# Patient Record
Sex: Male | Born: 1975 | Race: Black or African American | Hispanic: No | Marital: Single | Smoking: Never smoker
Health system: Southern US, Community
[De-identification: ages and names within clinical notes are randomized; demographics above are authoritative.]

---

## 2001-05-20 ENCOUNTER — Emergency Department (HOSPITAL_COMMUNITY): Admission: EM | Admit: 2001-05-20 | Discharge: 2001-05-20 | Payer: Self-pay | Admitting: Emergency Medicine

## 2001-05-20 ENCOUNTER — Encounter: Payer: Self-pay | Admitting: Emergency Medicine

## 2001-05-25 ENCOUNTER — Emergency Department (HOSPITAL_COMMUNITY): Admission: EM | Admit: 2001-05-25 | Discharge: 2001-05-26 | Payer: Self-pay | Admitting: Emergency Medicine

## 2001-05-26 ENCOUNTER — Encounter: Payer: Self-pay | Admitting: Emergency Medicine

## 2001-05-27 ENCOUNTER — Emergency Department (HOSPITAL_COMMUNITY): Admission: EM | Admit: 2001-05-27 | Discharge: 2001-05-27 | Payer: Self-pay | Admitting: Emergency Medicine

## 2001-06-18 ENCOUNTER — Emergency Department (HOSPITAL_COMMUNITY): Admission: EM | Admit: 2001-06-18 | Discharge: 2001-06-18 | Payer: Self-pay | Admitting: Emergency Medicine

## 2002-10-06 ENCOUNTER — Emergency Department (HOSPITAL_COMMUNITY): Admission: EM | Admit: 2002-10-06 | Discharge: 2002-10-07 | Payer: Self-pay | Admitting: Emergency Medicine

## 2004-01-13 ENCOUNTER — Emergency Department (HOSPITAL_COMMUNITY): Admission: EM | Admit: 2004-01-13 | Discharge: 2004-01-13 | Payer: Self-pay | Admitting: *Deleted

## 2004-04-23 ENCOUNTER — Emergency Department (HOSPITAL_COMMUNITY): Admission: EM | Admit: 2004-04-23 | Discharge: 2004-04-23 | Payer: Self-pay | Admitting: Family Medicine

## 2004-05-28 ENCOUNTER — Emergency Department (HOSPITAL_COMMUNITY): Admission: EM | Admit: 2004-05-28 | Discharge: 2004-05-28 | Payer: Self-pay | Admitting: Emergency Medicine

## 2004-06-06 ENCOUNTER — Emergency Department (HOSPITAL_COMMUNITY): Admission: EM | Admit: 2004-06-06 | Discharge: 2004-06-06 | Payer: Self-pay | Admitting: *Deleted

## 2007-04-26 ENCOUNTER — Ambulatory Visit (HOSPITAL_COMMUNITY): Admission: RE | Admit: 2007-04-26 | Discharge: 2007-04-26 | Payer: Self-pay | Admitting: Podiatry

## 2011-05-02 NOTE — Consult Note (Signed)
NAMEKOLDEN, DUPEE NO.:  [id number]   MEDICAL RECORD NO.:  [id number]                   PATIENT TYPE:  EMS   LOCATION:  [facility]                                 FACILITY:  [facility]   PHYSICIAN:  Carole Binning, M.D. [facility]         DATE OF BIRTH:  12-31-1975   DATE OF CONSULTATION:  05/28/2004  DATE OF DISCHARGE:                                   CONSULTATION   REFERRING PHYSICIAN:  Devoria Albe, M.D.   CHIEF COMPLAINT:  1. Shortness of breath.  2. Throat tightness.   HISTORY OF PRESENT ILLNESS:  Mr. Tolen is a 35 year old African American  male with a history of childhood asthma.  He has no known prior cardiac  history.  One month ago, he had symptoms of a mild fever and sinus  congestion and drainage of greenish and then whitish material.  Over the  past two weeks, he has noted symptoms of chest congestion and throat  tightness, which he says has been exacerbated by the hot humid weather.  These symptoms have persisted and his throat tightness has become more  progressive.  He thus presented to the emergency room.   Originally the patient is able to exercise on a regular basis without  limitations.  He does have occasional chest tightness in association with  shortness of breath.  He does have a history significant for childhood  asthma.  His cardiac risk factors are negative for hypertension, diabetes,  hyperlipidemia, significant tobacco use, or a family history of premature  coronary artery disease.   PAST MEDICAL HISTORY:  Significant only for childhood asthma.   MEDICATIONS:  None.   ALLERGIES:  No known drug allergies.   SOCIAL HISTORY:  The patient is single and self-employed.  He does have one  son.  He works as a Paediatric nurse.   HABITS:  Tobacco:  Occasional.  Alcohol:  None.  Drugs:  None.   FAMILY HISTORY:  His mother is alive and well at age 85.  His father is  alive at age 58, health status unknown.  He has five half-siblings who are  all healthy.   REVIEW OF SYSTEMS:  As documented in the physician assistant's handwritten  admission note.   PHYSICAL EXAMINATION:  GENERAL APPEARANCE:  This is a well-appearing young  male in no acute distress.  VITAL SIGNS:  Temperature afebrile, pulse 77 and regular, respirations 14,  blood pressure 131/75, and oxygen saturation on room air is 100%.  SKIN:  Warm and dry without generalized rash.  HEENT:  Normocephalic and atraumatic.  Sclerae anicteric.  Oral mucosa  unremarkable.  NECK:  No adenopathy or thyromegaly.  No JVD.  Carotid upstroke normal  without bruit.  CHEST:  Clear to percussion.  It is also clear to auscultation with no  wheezes and good air movement.  CARDIAC:  PMI nondisplaced.  Regular rate and rhythm.  No rub, murmur, or  S3.  ABDOMEN:  Soft and nontender without organomegaly.  Normal bowel sounds.  No  bruit.  EXTREMITIES:  No cyanosis, clubbing, or edema.  Peripheral pulses 2+  throughout.   LABORATORY DATA:  EKG reveals sinus rhythm at a rate of 93 with first degree  AV block and right axis deviation.  Right ventricular hypertrophy cannot be  ruled out.   Other laboratory data is documented in the chart and handwritten note.   ASSESSMENT:  The patient is a 35 year old male with reactive disease, who  presents with probable asthma exacerbation.  He does have an abnormal EKG  with a right axis deviation and right ventricular hypertrophy cannot be  ruled out.  This actually has been present on previous EKGs as well.  I  suspect this may be persistence of a juvenile pattern, however, cannot rule  out right ventricular hypertrophy related to his pulmonary disease.  Would  also need to rule out atrioseptal defect.   RECOMMENDATIONS:  My recommendation is for an outpatient echocardiogram.  Would also recommend treatment of his asthma per the emergency room  physicians.                                               Carole Binning, M.D. [facility]     MWP/MEDQ  D:  05/28/2004  T:  05/28/2004  Job:  [id number]

## 2012-09-29 ENCOUNTER — Emergency Department (INDEPENDENT_AMBULATORY_CARE_PROVIDER_SITE_OTHER)
Admission: EM | Admit: 2012-09-29 | Discharge: 2012-09-29 | Disposition: A | Discharge status: Home / Self Care | Payer: Managed Care, Other (non HMO) | Source: Home / Self Care | Attending: Emergency Medicine | Admitting: Emergency Medicine

## 2012-09-29 ENCOUNTER — Encounter (HOSPITAL_COMMUNITY): Payer: Self-pay

## 2012-09-29 DIAGNOSIS — L03119 Cellulitis of unspecified part of limb: Secondary | ICD-10-CM

## 2012-09-29 DIAGNOSIS — L03115 Cellulitis of right lower limb: Secondary | ICD-10-CM

## 2012-09-29 MED ORDER — CEFTRIAXONE SODIUM 1 G IJ SOLR
INTRAMUSCULAR | Status: AC
  Filled 2012-09-29: qty 10

## 2012-09-29 MED ORDER — CEPHALEXIN 500 MG PO CAPS: 500.0000 mg | ORAL_CAPSULE | Freq: Four times a day (QID) | ORAL | Status: DC

## 2012-09-29 MED ORDER — CEFTRIAXONE SODIUM 1 G IJ SOLR: 1.0000 g | Freq: Once | INTRAMUSCULAR | Status: DC

## 2012-09-29 MED ORDER — LIDOCAINE HCL (PF) 1 % IJ SOLN
INTRAMUSCULAR | Status: AC
  Filled 2012-09-29: qty 5

## 2012-09-29 NOTE — ED Provider Notes (Signed)
History     CSN: [id number]  Arrival date & time 09/29/12  1002   First MD Initiated Contact with Patient 09/29/12 1006      Chief Complaint  Patient presents with  . Insect Bite    (Consider location/radiation/quality/duration/timing/severity/associated sxs/prior treatment) HPI Comments: Nights ago patient felt subcarinal leg then he felt a stinging in his right lateral calf. Over the ensuing 2-3 days or spin swelling redness pain tenderness to develop in the right lateral lower leg. A pustule developed at one point he squeezed it and a drop of pus came out followed by blood. A few hours later of very small pustule reformed. He denies systemic symptoms no documented fever, chills shortness of breath, chest pain, headache, muscle cramps or GI symptoms.   History reviewed. No pertinent past medical history.  History reviewed. No pertinent past surgical history.  No family history on file.  History  Substance Use Topics  . Smoking status: Never Smoker   . Smokeless tobacco: Not on file  . Alcohol Use: No      Review of Systems  Constitutional: Negative.  Negative for fever, chills, activity change, appetite change and fatigue.  HENT: Negative.   Eyes: Negative.   Respiratory: Negative.   Cardiovascular: Negative.   Gastrointestinal: Negative.   Genitourinary: Negative.   Musculoskeletal: Negative for back pain, joint swelling and arthralgias.       As per HPI  Skin: Positive for color change and wound.  Neurological: Negative for dizziness, weakness, numbness and headaches.    Allergies  Review of patient's allergies indicates no known allergies.  Home Medications   Current Outpatient Rx  Name Route Sig Dispense Refill  . CEPHALEXIN 500 MG PO CAPS Oral Take 1 capsule (500 mg total) by mouth 4 (four) times daily. 28 capsule 0    BP 115/72  Pulse 91  Temp 99.1 F (37.3 C) (Oral)  Resp 18  SpO2 100%  Physical Exam  Constitutional: He is oriented to person,  place, and time. He appears well-developed and well-nourished.  HENT:  Head: Normocephalic and atraumatic.  Eyes: EOM are normal. Left eye exhibits no discharge.  Neck: Normal range of motion. Neck supple.  Pulmonary/Chest: Effort normal.  Musculoskeletal: Normal range of motion. He exhibits tenderness.  Neurological: He is alert and oriented to person, place, and time. No cranial nerve deficit.  Skin: Skin is warm and dry. No rash noted. There is erythema.       There is an overweight shaped area of swelling approximately 10-12 cm in length/diameter the lateral aspect of the right lower leg. It does not involve the knee joint. There is a 2 mm  pustule over the mid section of the induration with underlying induration. This does not appear to be or palpates to be a fluctuant area. There are no signs of ulceration or epidermal necrosis. The skin layers are well intact and smooth.  Psychiatric: He has a normal mood and affect.    ED Course  Procedures (including critical care time)  Labs Reviewed - No data to display No results found.   1. Cellulitis of leg, right       MDM  The patient questioned about whether this needed and excision. At this time and with the appearance of the lesion this does not appear to be a fluctuant lesion with layers of underlying purulence. It is not typical of a MRSA type lesion at this point. The patient is instructed to apply warm compresses at least  4 times a day if not more and stop applying ice packs. Keflex 500 mg 4 times a day for 10 days. Rocephin 1 g IM now. If there is no improvement in 48-72 hours or if there is any worsening sooner the patient should we turned or go to the emergency department. For any signs of ulceration along with erythema and swelling he should go to the emergency department.        Hayden Rasmussen, NP 09/29/12 1147

## 2012-09-29 NOTE — ED Notes (Addendum)
Patient states that he thinks he was bitten by an insect approx 3 days ago on right leg, first only itched now very painful with a white head , patient states that he had some amoxicillin at this house and has taken approx 5 pills within the last 3 days, unsure of dosage

## 2012-09-30 NOTE — ED Provider Notes (Signed)
Medical screening examination/treatment/procedure(s) were performed by non-physician practitioner and as supervising physician I was immediately available for consultation/collaboration.  Leslee Home, M.D.   Reuben Likes, MD 09/30/12 [phone]

## 2013-11-13 ENCOUNTER — Emergency Department (INDEPENDENT_AMBULATORY_CARE_PROVIDER_SITE_OTHER)
Admission: EM | Admit: 2013-11-13 | Discharge: 2013-11-13 | Disposition: A | Discharge status: Home / Self Care | Payer: BC Managed Care – PPO | Source: Home / Self Care

## 2013-11-13 ENCOUNTER — Encounter (HOSPITAL_COMMUNITY): Payer: Self-pay | Admitting: Emergency Medicine

## 2013-11-13 DIAGNOSIS — L02412 Cutaneous abscess of left axilla: Secondary | ICD-10-CM

## 2013-11-13 DIAGNOSIS — IMO0002 Reserved for concepts with insufficient information to code with codable children: Secondary | ICD-10-CM

## 2013-11-13 MED ORDER — DOXYCYCLINE HYCLATE 100 MG PO CAPS: 100.0000 mg | ORAL_CAPSULE | Freq: Two times a day (BID) | ORAL | Status: DC

## 2013-11-13 NOTE — ED Provider Notes (Signed)
CSN: [id number]     Arrival date & time 11/13/13  1710 History   None    Chief Complaint  Patient presents with  . Recurrent Skin Infections    4 day hx of boil under left armpit.  noted whitish, bloody drainage coming from boil.     (Consider location/radiation/quality/duration/timing/severity/associated sxs/prior Treatment) HPI Comments: Patient presents with a painful cyst to her left axilla and over the last few days has been draining. No prior incidence. No fever or chills.   The history is provided by the patient.    History reviewed. No pertinent past medical history. History reviewed. No pertinent past surgical history. History reviewed. No pertinent family history. History  Substance Use Topics  . Smoking status: Never Smoker   . Smokeless tobacco: Not on file  . Alcohol Use: No    Review of Systems  All other systems reviewed and are negative.    Allergies  Review of patient's allergies indicates no known allergies.  Home Medications   Current Outpatient Rx  Name  Route  Sig  Dispense  Refill  . cephALEXin (KEFLEX) 500 MG capsule   Oral   Take 1 capsule (500 mg total) by mouth 4 (four) times daily.   28 capsule   0   . doxycycline (VIBRAMYCIN) 100 MG capsule   Oral   Take 1 capsule (100 mg total) by mouth 2 (two) times daily.   20 capsule   0    BP 125/73  Pulse 88  Temp(Src) 98 F (36.7 C) (Oral)  Resp 16  SpO2 98% Physical Exam  Constitutional: He is oriented to person, place, and time. He appears well-developed and well-nourished.  HENT:  Head: Normocephalic and atraumatic.  Neurological: He is alert and oriented to person, place, and time.  Skin: Skin is warm and dry.  Left axilla cyst with draining exudate and fluctuant with erythema surrounding. Tender to palpation     ED Course  INCISION AND DRAINAGE Date/Time: 11/13/2013 7:29 PM Performed by: Riki Sheer Authorized by: Riki Sheer Consent: Verbal consent  obtained. Risks and benefits: risks, benefits and alternatives were discussed Consent given by: patient Patient understanding: patient states understanding of the procedure being performed Patient consent: the patient's understanding of the procedure matches consent given Patient identity confirmed: verbally with patient Type: abscess Body area: upper extremity Anesthesia: local infiltration Local anesthetic: lidocaine 1% with epinephrine Patient sedated: no Scalpel size: 15 Packing material: 1/2 in iodoform gauze Patient tolerance: Patient tolerated the procedure well with no immediate complications.   (including critical care time) Labs Review Labs Reviewed - No data to display Imaging Review No results found.  EKG Interpretation    Date/Time:    Ventricular Rate:    PR Interval:    QRS Duration:   QT Interval:    QTC Calculation:   R Axis:     Text Interpretation:              MDM   1. Abscess of axilla, left   I&D with packing and dressing, wound care. Cover with antibiotics. F/U Tuesday for packing removal and wound care.   Riki Sheer, PA-C 11/13/13 1931

## 2013-11-13 NOTE — ED Notes (Signed)
4 day hx of boil under left armpit.  noted whitish, bloody drainage coming from boil.  Denies any OTC for boil.

## 2013-11-13 NOTE — ED Provider Notes (Signed)
Medical screening examination/treatment/procedure(s) were performed by non-physician practitioner and as supervising physician I was immediately available for consultation/collaboration.  Daiden Coltrane, M.D.  Sherill Mangen C Lundon Verdejo, MD 11/13/13 2024 

## 2013-11-15 ENCOUNTER — Encounter (HOSPITAL_COMMUNITY): Payer: Self-pay | Admitting: Emergency Medicine

## 2013-11-15 ENCOUNTER — Emergency Department (INDEPENDENT_AMBULATORY_CARE_PROVIDER_SITE_OTHER)
Admission: EM | Admit: 2013-11-15 | Discharge: 2013-11-15 | Disposition: A | Discharge status: Home / Self Care | Payer: BC Managed Care – PPO | Source: Home / Self Care

## 2013-11-15 DIAGNOSIS — Z4800 Encounter for change or removal of nonsurgical wound dressing: Secondary | ICD-10-CM

## 2013-11-15 DIAGNOSIS — Z09 Encounter for follow-up examination after completed treatment for conditions other than malignant neoplasm: Secondary | ICD-10-CM

## 2013-11-15 NOTE — ED Notes (Signed)
Pt here for follow up of left axillary abscess.  States "feels a lot better". Voices no complaints.  Taking meds as prescribed.

## 2013-11-15 NOTE — ED Provider Notes (Signed)
CSN: [id number]     Arrival date & time 11/15/13  1116 History   First MD Initiated Contact with Patient 11/15/13 1233     Chief Complaint  Patient presents with  . Follow-up   (Consider location/radiation/quality/duration/timing/severity/associated sxs/prior Treatment) HPI Comments: For packing removal and wound check s/p I and D abscess L axilla 2 d ago   History reviewed. No pertinent past medical history. History reviewed. No pertinent past surgical history. History reviewed. No pertinent family history. History  Substance Use Topics  . Smoking status: Never Smoker   . Smokeless tobacco: Not on file  . Alcohol Use: Yes    Review of Systems  Constitutional: Negative.        St the wound feeling much better. Minimal tenderness.  Skin:       Wound healing nicely, packing removed, no induration, erythema or swelling. No signs of infection.   All other systems reviewed and are negative.    Allergies  Review of patient's allergies indicates no known allergies.  Home Medications   Current Outpatient Rx  Name  Route  Sig  Dispense  Refill  . cephALEXin (KEFLEX) 500 MG capsule   Oral   Take 1 capsule (500 mg total) by mouth 4 (four) times daily.   28 capsule   0   . doxycycline (VIBRAMYCIN) 100 MG capsule   Oral   Take 1 capsule (100 mg total) by mouth 2 (two) times daily.   20 capsule   0    BP 110/70  Pulse 80  Temp(Src) 98 F (36.7 C) (Oral)  Resp 16  SpO2 100% Physical Exam  ED Course  Procedures (including critical care time) Labs Review Labs Reviewed - No data to display Imaging Review No results found.  And and and  MDM   1. Follow-up exam after treatment      Wound care instructions, may shower, cover for next 2-3 days  Hayden Rasmussen, NP 11/15/13 1303

## 2013-11-16 NOTE — ED Provider Notes (Signed)
Medical screening examination/treatment/procedure(s) were performed by a resident physician or non-physician practitioner and as the supervising physician I was immediately available for consultation/collaboration.  Dajiah Kooi, MD    Nyla Creason S Donya Tomaro, MD 11/16/13 0922 

## 2018-04-18 ENCOUNTER — Emergency Department (HOSPITAL_COMMUNITY): Payer: Self-pay

## 2018-04-18 ENCOUNTER — Emergency Department (HOSPITAL_COMMUNITY)
Admission: EM | Admit: 2018-04-18 | Discharge: 2018-04-18 | Disposition: A | Discharge status: Home / Self Care | Payer: Self-pay | Attending: Emergency Medicine | Admitting: Emergency Medicine

## 2018-04-18 ENCOUNTER — Encounter (HOSPITAL_COMMUNITY): Payer: Self-pay | Admitting: Emergency Medicine

## 2018-04-18 DIAGNOSIS — Y929 Unspecified place or not applicable: Secondary | ICD-10-CM | POA: Insufficient documentation

## 2018-04-18 DIAGNOSIS — Y9389 Activity, other specified: Secondary | ICD-10-CM | POA: Insufficient documentation

## 2018-04-18 DIAGNOSIS — Y999 Unspecified external cause status: Secondary | ICD-10-CM | POA: Insufficient documentation

## 2018-04-18 DIAGNOSIS — X509XXA Other and unspecified overexertion or strenuous movements or postures, initial encounter: Secondary | ICD-10-CM | POA: Insufficient documentation

## 2018-04-18 DIAGNOSIS — S46911A Strain of unspecified muscle, fascia and tendon at shoulder and upper arm level, right arm, initial encounter: Secondary | ICD-10-CM | POA: Insufficient documentation

## 2018-04-18 MED ORDER — DICLOFENAC SODIUM 50 MG PO TBEC: 50.0000 mg | DELAYED_RELEASE_TABLET | Freq: Two times a day (BID) | ORAL | 0 refills | Status: DC

## 2018-04-18 MED ORDER — CYCLOBENZAPRINE HCL 10 MG PO TABS: 10.0000 mg | ORAL_TABLET | Freq: Every evening | ORAL | 0 refills | Status: DC | PRN

## 2018-04-18 NOTE — Discharge Instructions (Addendum)
Do not take the muscle relaxer if driving. If you pain continues, follow up with DR. Aluisio

## 2018-04-18 NOTE — ED Triage Notes (Signed)
Patient here from home with complaints of right shoulder pain. Denies trauma. States that he has been sleeping on the couch instead of the bed.

## 2018-04-18 NOTE — ED Provider Notes (Signed)
[facility]-EMERGENCY DEPT Provider Note   CSN: [id number] Arrival date & time: 04/18/18  1252     History   Chief Complaint Chief Complaint  Patient presents with  . Shoulder Pain    HPI Brenn Deziel is a 42 y.o. male who presents to the ED with shoulder pain. The pain is located in the right shoulder. Patient denies trauma but does stated he has been sleeping on the couch instead of his bed. 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    HPI  History reviewed. No pertinent past medical history.  There are no active problems to display for this patient.   History reviewed. No pertinent surgical history.      Home Medications    Prior to Admission medications   Medication Sig Start Date End Date Taking? Authorizing Provider  cyclobenzaprine (FLEXERIL) 10 MG tablet Take 1 tablet (10 mg total) by mouth at bedtime as needed for muscle spasms. 04/18/18   Janne Napoleon, NP  diclofenac (VOLTAREN) 50 MG EC tablet Take 1 tablet (50 mg total) by mouth 2 (two) times daily. 04/18/18   Janne Napoleon, NP    Family History No family history on file.  Social History Social History   Tobacco Use  . Smoking status: Never Smoker  . Smokeless tobacco: Never Used  Substance Use Topics  . Alcohol use: Never    Frequency: Never  . Drug use: Not on file     Allergies   Patient has no allergy information on record.   Review of Systems Review of Systems  Musculoskeletal: Positive for arthralgias.       Right shoulder pain  All other systems reviewed and are negative.    Physical Exam Updated Vital Signs BP 124/78 (BP Location: Right Arm)   Pulse 89   Temp 98.5 F (36.9 C) (Oral)   Resp 18   SpO2 100%   Physical Exam  Constitutional: He is oriented to person, place, and time. He appears well-developed and well-nourished. No distress.  HENT:  Head:  Normocephalic and atraumatic.  Eyes: EOM are normal.  Neck: Neck supple.  Cardiovascular: Normal rate.  Pulmonary/Chest: Effort normal.  Musculoskeletal:       Right shoulder: He exhibits tenderness and spasm. He exhibits normal range of motion, no swelling, no effusion, no crepitus, no deformity, no laceration, normal pulse and normal strength.  Full passive range of motion of the right shoulder without difficulty. Grips are equal, radial pulses 2+.   Neurological: He is alert and oriented to person, place, and time. No cranial nerve deficit.  Skin: Skin is warm and dry.  Psychiatric: He has a normal mood and affect.  Nursing note and vitals reviewed.    ED Treatments / Results  Labs (all labs ordered are listed, but only abnormal results are displayed) Labs Reviewed - No data to display  Radiology Dg Shoulder Right  Result Date: 04/18/2018 CLINICAL DATA:  Shoulder pain for a couple days.  No known injury. EXAM: RIGHT SHOULDER - 2+ VIEW COMPARISON:  None. FINDINGS: The mineralization and alignment are normal. There is no evidence of acute fracture or dislocation. The subacromial space is preserved. No significant arthropathic changes. IMPRESSION: Normal right shoulder radiographs. Electronically Signed   By: Carey Bullocks M.D.   On: 04/18/2018 14:46    Procedures Procedures (including critical care time)  Medications Ordered  in ED Medications - No data to display   Initial Impression / Assessment and Plan / ED Course  I have reviewed the triage vital signs and the nursing notes. 42 y.o. male here with right shoulder pain stable for d/c without fracture or dislocation noted on x-ray, no concern for deltoid disruption or rotator cuff injury, no focal neuro deficits. Will treat for muscle strain and spasm. Patient will f/u with ortho if symptoms persist.  Final Clinical Impressions(s) / ED Diagnoses   Final diagnoses:  Strain of right shoulder, initial encounter    ED  Discharge Orders        Ordered    diclofenac (VOLTAREN) 50 MG EC tablet  2 times daily     04/18/18 1554    cyclobenzaprine (FLEXERIL) 10 MG tablet  At bedtime PRN     04/18/18 1554       Damian Leavell [place], NP 04/18/18 1559    Shaune Pollack, MD 04/19/18 [phone]

## 2018-04-28 ENCOUNTER — Encounter (HOSPITAL_COMMUNITY): Payer: Self-pay | Admitting: Emergency Medicine

## 2020-11-14 ENCOUNTER — Ambulatory Visit: Payer: Self-pay | Admitting: Specialist

## 2021-01-02 ENCOUNTER — Ambulatory Visit (INDEPENDENT_AMBULATORY_CARE_PROVIDER_SITE_OTHER): Payer: 59 | Admitting: Specialist

## 2021-01-02 ENCOUNTER — Encounter: Payer: Self-pay | Admitting: Specialist

## 2021-01-02 ENCOUNTER — Ambulatory Visit: Payer: Self-pay

## 2021-01-02 ENCOUNTER — Ambulatory Visit (INDEPENDENT_AMBULATORY_CARE_PROVIDER_SITE_OTHER): Payer: 59

## 2021-01-02 VITALS — BP 113/76 | HR 74 | Ht 71.0 in | Wt 178.0 lb

## 2021-01-02 DIAGNOSIS — M546 Pain in thoracic spine: Secondary | ICD-10-CM

## 2021-01-02 DIAGNOSIS — M542 Cervicalgia: Secondary | ICD-10-CM | POA: Diagnosis not present

## 2021-01-02 DIAGNOSIS — M4802 Spinal stenosis, cervical region: Secondary | ICD-10-CM

## 2021-01-02 DIAGNOSIS — M549 Dorsalgia, unspecified: Secondary | ICD-10-CM | POA: Diagnosis not present

## 2021-01-02 DIAGNOSIS — M5030 Other cervical disc degeneration, unspecified cervical region: Secondary | ICD-10-CM

## 2021-01-02 MED ORDER — METHYLPREDNISOLONE 4 MG PO TBPK: ORAL_TABLET | ORAL | 0 refills | Status: AC

## 2021-01-02 NOTE — Patient Instructions (Signed)
Plan: Avoid overhead lifting and overhead use of the arms. Do not lift greater than 10-15 lbs. Adjust head rest in vehicle to prevent hyperextension if rear ended. Take extra precautions to avoid falling. MRI of the cervical spine to assess for cervical stenosis. Medrol Dose Pak 6 day dose pak. Remove from work place starting on Monday 01/07/2021 through 01/14/2021.

## 2021-01-02 NOTE — Progress Notes (Signed)
Office Visit Note   Patient: Adrian Cooper           Date of Birth: March 30, 1976           MRN: [id number] Visit Date: 01/02/2021              Requested by: No referring provider defined for this encounter. PCP: Patient, No Pcp Per   Assessment & Plan: Visit Diagnoses:  1. Neck pain   2. Upper back pain   3. Spinal stenosis of cervical region   4. Pain in thoracic spine   5. DDD (degenerative disc disease), cervical     Plan: Avoid overhead lifting and overhead use of the arms. Do not lift greater than 5 lbs. Adjust head rest in vehicle to prevent hyperextension if rear ended. Take extra precautions to avoid falling. MRI of the cervical spine to assess for cervical stenosis. Medrol Dose Pak 6 day dose pak. Remove from work place starting on Monday 01/07/2021 through 01/14/2021.    Follow-Up Instructions: Return in about 3 weeks (around 01/23/2021).   Orders:  Orders Placed This Encounter  Procedures  . XR Cervical Spine 2 or 3 views  . XR Thoracic Spine 2 View  . MR Cervical Spine w/o contrast   Meds ordered this encounter  Medications  . methylPREDNISolone (MEDROL DOSEPAK) 4 MG TBPK tablet    Sig: 6 day dose pack take as directed    Dispense:  21 tablet    Refill:  0      Procedures: No procedures performed   Clinical Data: No additional findings.   Subjective: Chief Complaint  Patient presents with  . Middle Back - Pain    HPI  Review of Systems  Constitutional: Negative.   HENT: Negative.   Eyes: Negative.   Respiratory: Negative.   Cardiovascular: Negative.   Gastrointestinal: Negative.   Endocrine: Negative.   Genitourinary: Negative.   Musculoskeletal: Negative.   Skin: Negative.   Allergic/Immunologic: Negative.   Neurological: Negative.   Hematological: Negative.   Psychiatric/Behavioral: Negative.      Objective: Vital Signs: BP 113/76   Pulse 74   Physical Exam Constitutional:      Appearance: He is well-developed and  well-nourished.  HENT:     Head: Normocephalic and atraumatic.  Eyes:     Extraocular Movements: EOM normal.     Pupils: Pupils are equal, round, and reactive to light.  Pulmonary:     Effort: Pulmonary effort is normal.     Breath sounds: Normal breath sounds.  Abdominal:     General: Bowel sounds are normal.     Palpations: Abdomen is soft.  Musculoskeletal:     Cervical back: Normal range of motion and neck supple.     Lumbar back: Negative right straight leg raise test and negative left straight leg raise test.  Skin:    General: Skin is warm and dry.  Neurological:     Mental Status: He is alert and oriented to person, place, and time.  Psychiatric:        Mood and Affect: Mood and affect normal.        Behavior: Behavior normal.        Thought Content: Thought content normal.        Judgment: Judgment normal.     Back Exam   Tenderness  The patient is experiencing tenderness in the cervical.  Range of Motion  Extension: abnormal  Flexion: abnormal  Lateral bend right: normal  Lateral  bend left: normal  Rotation right: normal  Rotation left: normal   Muscle Strength  Right Quadriceps:  5/5  Left Quadriceps:  5/5  Right Hamstrings:  5/5  Left Hamstrings:  5/5   Tests  Straight leg raise right: negative Straight leg raise left: negative  Reflexes  Patellar: 2/4 Achilles: 2/4 Biceps: 1/4 Babinski's sign: normal       Specialty Comments:  No specialty comments available.  Imaging: XR Cervical Spine 2 or 3 views  Result Date: 01/02/2021 AP and lateral radiographs of the cervical spine demonstrate DDD C5-6, narrowing of the disc at this segment, straightening of the upper cervical canal. The torg ratio at C5-6 is 0.5-0.6 and suggests cervical stenosis that is significant. No acute bone changes noted no significant UV changes noted.     PMFS History: There are no problems to display for this patient.  History reviewed. No pertinent past medical  history.  History reviewed. No pertinent family history.  History reviewed. No pertinent surgical history. Social History   Occupational History  . Not on file  Tobacco Use  . Smoking status: Never Smoker  . Smokeless tobacco: Never Used  Substance and Sexual Activity  . Alcohol use: Never  . Drug use: No  . Sexual activity: Never    Birth control/protection: Condom

## 2021-01-07 ENCOUNTER — Telehealth: Payer: Self-pay | Admitting: Specialist

## 2021-01-07 ENCOUNTER — Encounter: Payer: Self-pay | Admitting: Radiology

## 2021-01-07 NOTE — Telephone Encounter (Signed)
Note written

## 2021-01-07 NOTE — Telephone Encounter (Signed)
Pt states instead of being taken out of work before he has his MRI on 01/20/21 he'd like to wait so he can know the care plan and then be taken out of work according to that. Pt would like a CB to discuss further   [phone]

## 2021-01-20 ENCOUNTER — Ambulatory Visit
Admission: RE | Admit: 2021-01-20 | Discharge: 2021-01-20 | Disposition: A | Discharge status: Home / Self Care | Payer: 59 | Source: Ambulatory Visit | Attending: Specialist | Admitting: Specialist

## 2021-01-20 ENCOUNTER — Other Ambulatory Visit: Payer: Self-pay

## 2021-01-20 DIAGNOSIS — M549 Dorsalgia, unspecified: Secondary | ICD-10-CM

## 2021-01-20 DIAGNOSIS — M542 Cervicalgia: Secondary | ICD-10-CM

## 2021-01-20 DIAGNOSIS — M4802 Spinal stenosis, cervical region: Secondary | ICD-10-CM

## 2021-01-24 ENCOUNTER — Encounter: Payer: Self-pay | Admitting: Specialist

## 2021-01-24 ENCOUNTER — Ambulatory Visit (INDEPENDENT_AMBULATORY_CARE_PROVIDER_SITE_OTHER): Payer: 59 | Admitting: Specialist

## 2021-01-24 VITALS — BP 110/73 | HR 68 | Ht 71.0 in | Wt 178.0 lb

## 2021-01-24 DIAGNOSIS — M4802 Spinal stenosis, cervical region: Secondary | ICD-10-CM

## 2021-01-24 MED ORDER — DICLOFENAC POTASSIUM 50 MG PO TABS: ORAL_TABLET | ORAL | 3 refills | Status: AC
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
## 2021-02-21 ENCOUNTER — Ambulatory Visit: Payer: 59 | Admitting: Specialist

## 2021-08-09 ENCOUNTER — Other Ambulatory Visit (HOSPITAL_COMMUNITY): Payer: Self-pay

## 2022-01-28 IMAGING — MR MR CERVICAL SPINE W/O CM
5 series · 36 of 48 positions shown · non-contrast
Comparison: None.

EXAM:
MRI CERVICAL SPINE WITHOUT CONTRAST
TECHNIQUE: Multiplanar, multisequence MR imaging of the cervical spine was
performed. No intravenous contrast was administered.

[Series 2: T2 · sagittal · 3.0mm · 0.41mm/px · 8 of 18 slices shown (1 of 2)]
[im 1/18]
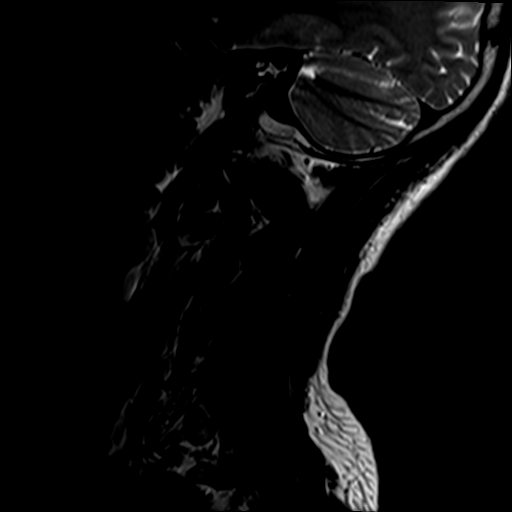
[im 3/18]
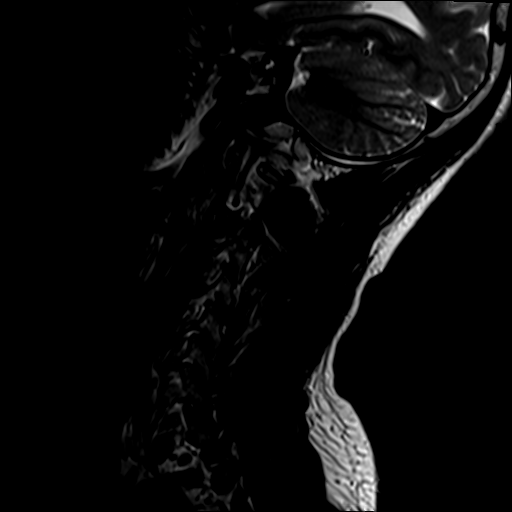
[im 5/18]
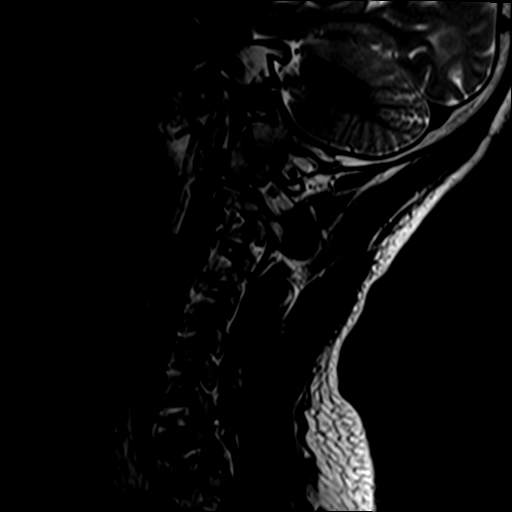
[im 8/18]
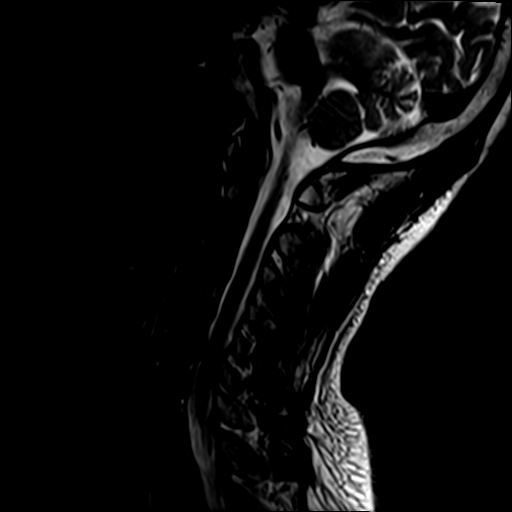
[im 10/18]
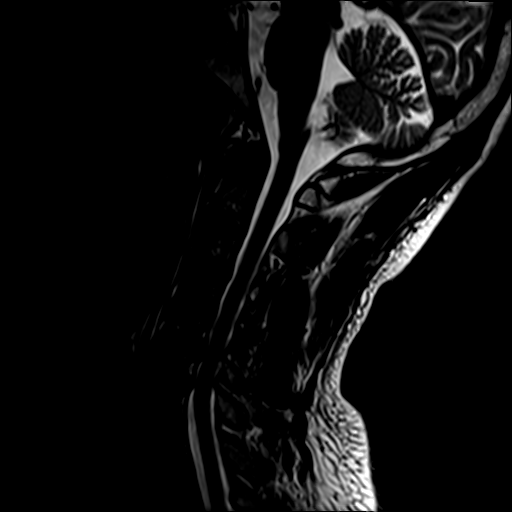
[im 13/18]
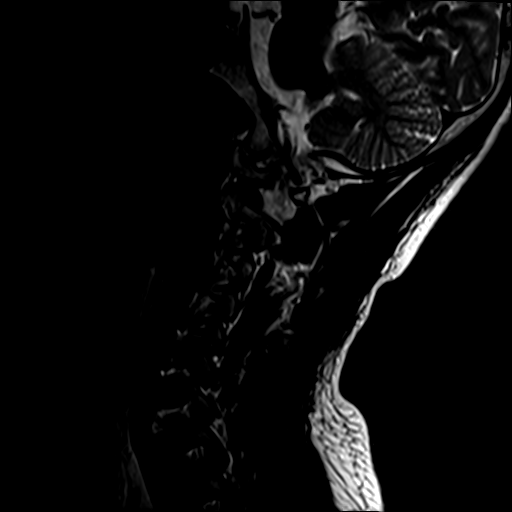
[im 15/18]
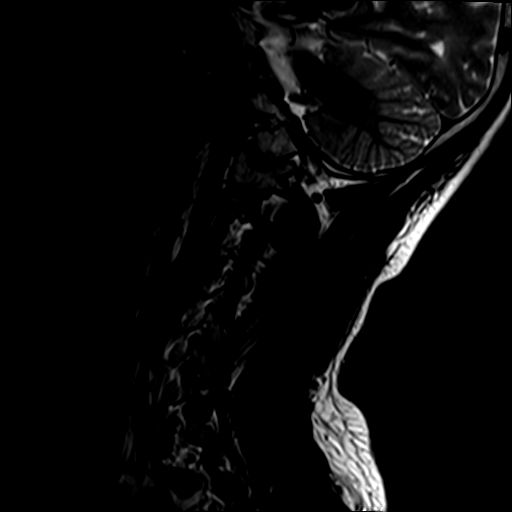
[im 18/18]
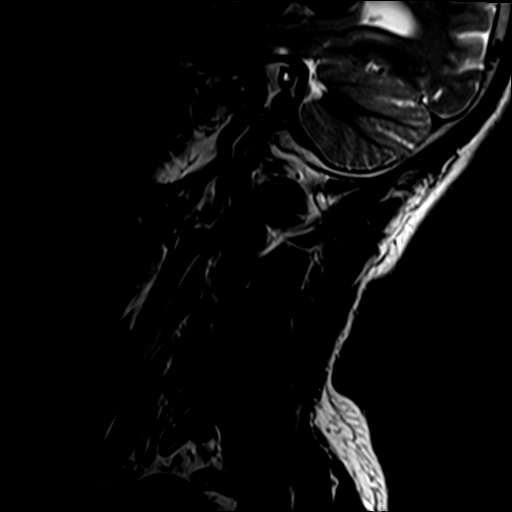

[Series 3: STIR · sagittal · 3.0mm · 0.82mm/px · 8 of 18 slices shown]
[im 1/18]
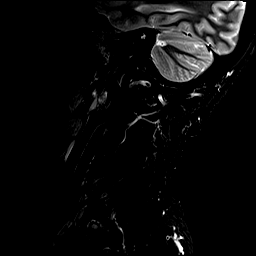
[im 3/18]
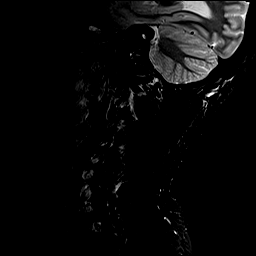
[im 5/18]
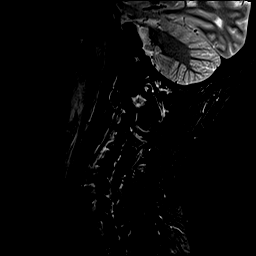
[im 8/18]
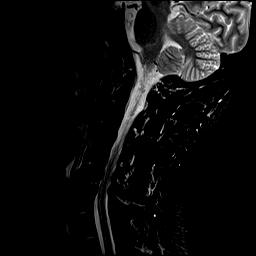
[im 10/18]
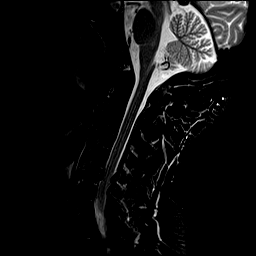
[im 13/18]
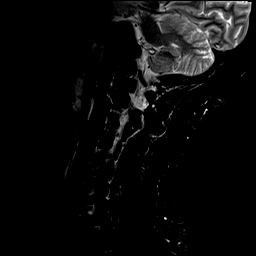
[im 15/18]
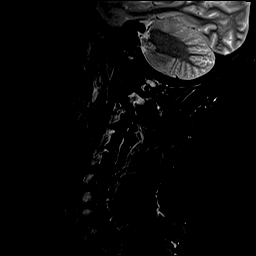
[im 18/18]
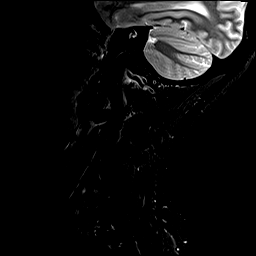

[Series 4: T1 · sagittal · 3.0mm · 0.82mm/px · 8 of 18 slices shown]
[im 1/18]
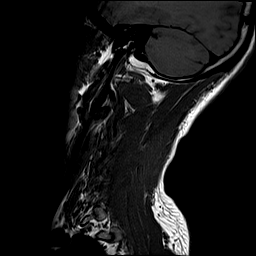
[im 3/18]
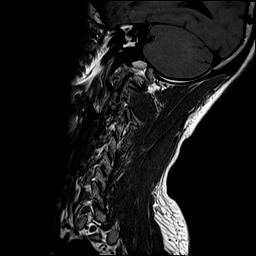
[im 5/18]
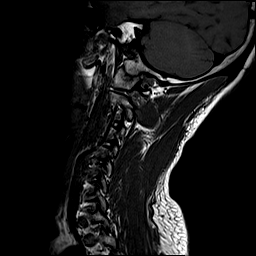
[im 8/18]
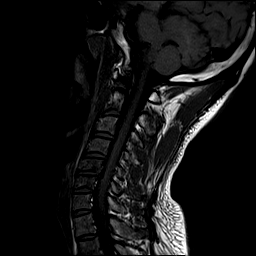
[im 10/18]
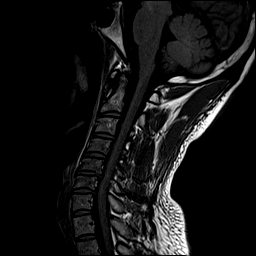
[im 13/18]
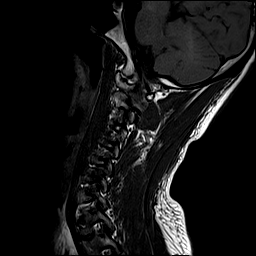
[im 15/18]
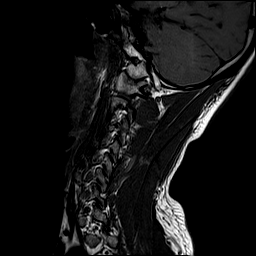
[im 18/18]
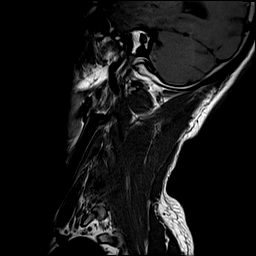

[Series 5: T2 · axial · 3.0mm · 0.70mm/px · z∈[-61,+37]mm · 9 of 27 slices shown (2 of 2)]
[im 1/27]
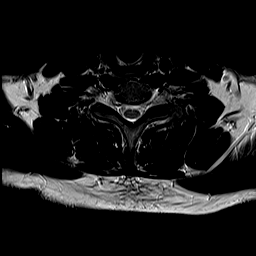
[im 5/27]
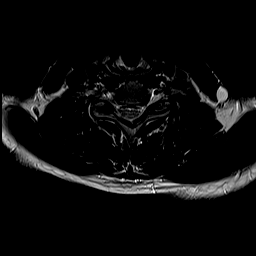
[im 8/27]
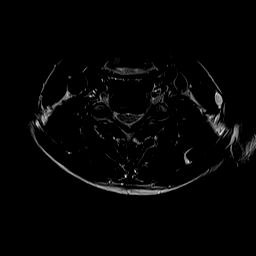
[im 12/27]
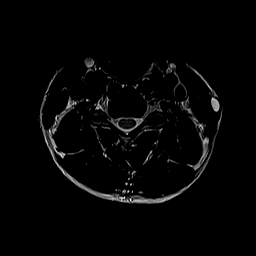
[im 15/27]
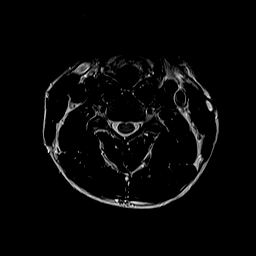
[im 19/27]
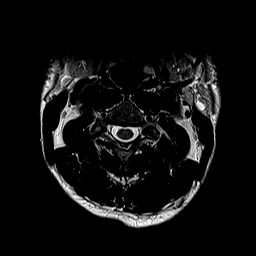
[im 22/27]
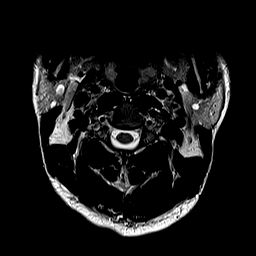
[im 24/27]
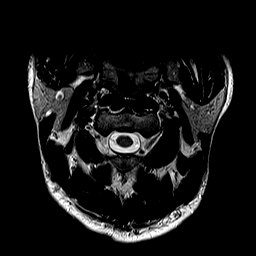
[im 27/27]
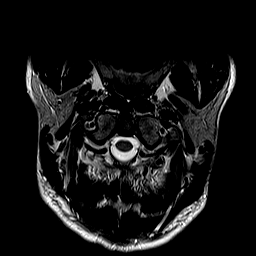

[Series 6: GRE · axial · 3.0mm · 0.35mm/px · z∈[-60,-37]mm · 3 of 26 slices shown]
[im 1/26]
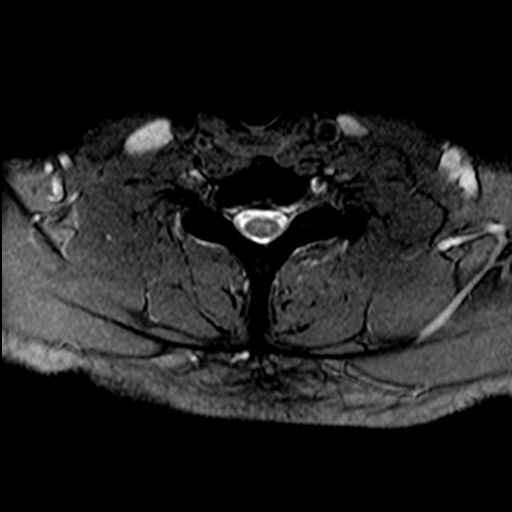
[im 5/26]
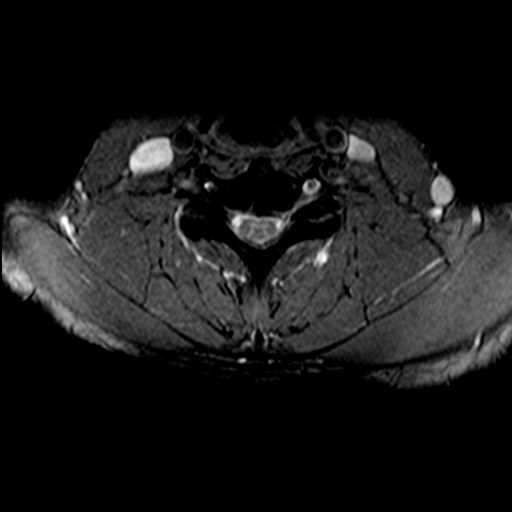
[im 7/26]
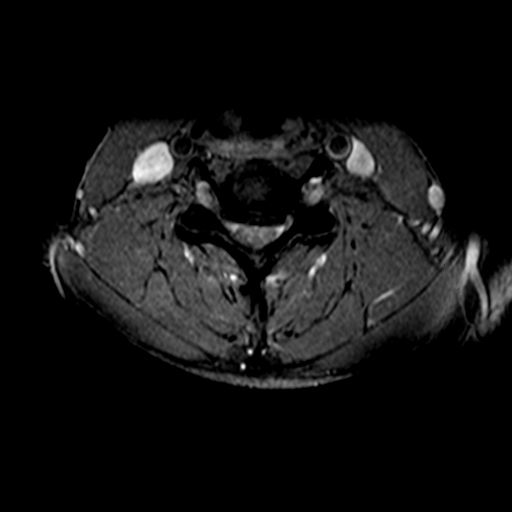

[36 of 48 positions shown; findings below may reference images not displayed]

FINDINGS: Alignment: Mild straightening of the normal cervical lordosis
without substantial sagittal subluxation.

Vertebrae: Vertebral body heights are maintained. No specific
evidence of acute fracture, discitis/osteomyelitis, or suspicious
bone lesion.

Cord: No convincing evidence of cord signal abnormality.

Posterior Fossa, vertebral arteries, paraspinal tissues: Negative.

Disc levels:

C2-C3: Mild left greater than right facet and uncovertebral
hypertrophy without significant canal or foraminal stenosis.

C3-C4: Small posterior disc osteophyte complex and mild bilateral
uncovertebral hypertrophy without significant canal or foraminal
stenosis.

C4-C5: Small posterior disc osteophyte complex and right greater
than left facet and uncovertebral hypertrophy with mild right
foraminal stenosis.

C5-C6: Posterior disc osteophyte complex and bilateral facet and
uncovertebral hypertrophy. Resulting moderate canal and bilateral
foraminal stenosis.

C6-C7: Small posterior disc osteophyte complex and bilateral Co
vertebral per trapeze. Mild canal and left foraminal stenosis.

C7-T1: Partially imaged on axial series without evidence of
significant canal or foraminal stenosis.
IMPRESSION: 1. At C5-C6 there is moderate canal and bilateral foraminal
stenosis.
2. At C6-C7 there is mild canal stenosis and mild left foraminal
stenosis.
3. At C4-C5 there is mild right foraminal stenosis.
